# Patient Record
Sex: Female | Born: 2000 | Race: White | Hispanic: No | Marital: Single | Smoking: Never smoker
Health system: Southern US, Community
[De-identification: ages and names within clinical notes are randomized; demographics above are authoritative.]

## PROBLEM LIST (undated history)

## (undated) DIAGNOSIS — F419 Anxiety disorder, unspecified: Secondary | ICD-10-CM

## (undated) DIAGNOSIS — J302 Other seasonal allergic rhinitis: Secondary | ICD-10-CM

## (undated) DIAGNOSIS — M419 Scoliosis, unspecified: Secondary | ICD-10-CM

## (undated) HISTORY — DX: Other seasonal allergic rhinitis: J30.2

## (undated) HISTORY — DX: Scoliosis, unspecified: M41.9

## (undated) HISTORY — DX: Anxiety disorder, unspecified: F41.9

---
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
## 2019-02-25 ENCOUNTER — Other Ambulatory Visit: Payer: Self-pay

## 2020-07-13 ENCOUNTER — Encounter: Payer: Self-pay | Admitting: Nurse Practitioner

## 2020-07-13 ENCOUNTER — Other Ambulatory Visit: Payer: Self-pay

## 2020-07-13 ENCOUNTER — Ambulatory Visit (INDEPENDENT_AMBULATORY_CARE_PROVIDER_SITE_OTHER): Payer: 59 | Admitting: Nurse Practitioner

## 2020-07-13 VITALS — BP 116/74 | Ht 62.0 in | Wt 144.0 lb

## 2020-07-13 DIAGNOSIS — Z01419 Encounter for gynecological examination (general) (routine) without abnormal findings: Secondary | ICD-10-CM

## 2020-07-13 DIAGNOSIS — D229 Melanocytic nevi, unspecified: Secondary | ICD-10-CM

## 2020-07-13 DIAGNOSIS — N946 Dysmenorrhea, unspecified: Secondary | ICD-10-CM | POA: Diagnosis not present

## 2020-07-13 NOTE — Patient Instructions (Signed)
Health Maintenance, Female Adopting a healthy lifestyle and getting preventive care are important in promoting health and wellness. Ask your health care provider about:  The right schedule for you to have regular tests and exams.  Things you can do on your own to prevent diseases and keep yourself healthy. What should I know about diet, weight, and exercise? Eat a healthy diet  Eat a diet that includes plenty of vegetables, fruits, low-fat dairy products, and lean protein.  Do not eat a lot of foods that are high in solid fats, added sugars, or sodium.   Maintain a healthy weight Body mass index (BMI) is used to identify weight problems. It estimates body fat based on height and weight. Your health care provider can help determine your BMI and help you achieve or maintain a healthy weight. Get regular exercise Get regular exercise. This is one of the most important things you can do for your health. Most adults should:  Exercise for at least 150 minutes each week. The exercise should increase your heart rate and make you sweat (moderate-intensity exercise).  Do strengthening exercises at least twice a week. This is in addition to the moderate-intensity exercise.  Spend less time sitting. Even light physical activity can be beneficial. Watch cholesterol and blood lipids Have your blood tested for lipids and cholesterol at 20 years of age, then have this test every 5 years. Have your cholesterol levels checked more often if:  Your lipid or cholesterol levels are high.  You are older than 21 years of age.  You are at high risk for heart disease. What should I know about cancer screening? Depending on your health history and family history, you may need to have cancer screening at various ages. This may include screening for:  Breast cancer.  Cervical cancer.  Colorectal cancer.  Skin cancer.  Lung cancer. What should I know about heart disease, diabetes, and high blood  pressure? Blood pressure and heart disease  High blood pressure causes heart disease and increases the risk of stroke. This is more likely to develop in people who have high blood pressure readings, are of African descent, or are overweight.  Have your blood pressure checked: ? Every 3-5 years if you are 18-39 years of age. ? Every year if you are 40 years old or older. Diabetes Have regular diabetes screenings. This checks your fasting blood sugar level. Have the screening done:  Once every three years after age 40 if you are at a normal weight and have a low risk for diabetes.  More often and at a younger age if you are overweight or have a high risk for diabetes. What should I know about preventing infection? Hepatitis B If you have a higher risk for hepatitis B, you should be screened for this virus. Talk with your health care provider to find out if you are at risk for hepatitis B infection. Hepatitis C Testing is recommended for:  Everyone born from 1945 through 1965.  Anyone with known risk factors for hepatitis C. Sexually transmitted infections (STIs)  Get screened for STIs, including gonorrhea and chlamydia, if: ? You are sexually active and are younger than 21 years of age. ? You are older than 21 years of age and your health care provider tells you that you are at risk for this type of infection. ? Your sexual activity has changed since you were last screened, and you are at increased risk for chlamydia or gonorrhea. Ask your health care provider   if you are at risk.  Ask your health care provider about whether you are at high risk for HIV. Your health care provider may recommend a prescription medicine to help prevent HIV infection. If you choose to take medicine to prevent HIV, you should first get tested for HIV. You should then be tested every 3 months for as long as you are taking the medicine. Pregnancy  If you are about to stop having your period (premenopausal) and  you may become pregnant, seek counseling before you get pregnant.  Take 400 to 800 micrograms (mcg) of folic acid every day if you become pregnant.  Ask for birth control (contraception) if you want to prevent pregnancy. Osteoporosis and menopause Osteoporosis is a disease in which the bones lose minerals and strength with aging. This can result in bone fractures. If you are 65 years old or older, or if you are at risk for osteoporosis and fractures, ask your health care provider if you should:  Be screened for bone loss.  Take a calcium or vitamin D supplement to lower your risk of fractures.  Be given hormone replacement therapy (HRT) to treat symptoms of menopause. Follow these instructions at home: Lifestyle  Do not use any products that contain nicotine or tobacco, such as cigarettes, e-cigarettes, and chewing tobacco. If you need help quitting, ask your health care provider.  Do not use street drugs.  Do not share needles.  Ask your health care provider for help if you need support or information about quitting drugs. Alcohol use  Do not drink alcohol if: ? Your health care provider tells you not to drink. ? You are pregnant, may be pregnant, or are planning to become pregnant.  If you drink alcohol: ? Limit how much you use to 0-1 drink a day. ? Limit intake if you are breastfeeding.  Be aware of how much alcohol is in your drink. In the [place] one drink equals one 12 oz bottle of beer (355 mL), one 5 oz glass of wine (148 mL), or one 1 oz glass of hard liquor (44 mL). General instructions  Schedule regular health, dental, and eye exams.  Stay current with your vaccines.  Tell your health care provider if: ? You often feel depressed. ? You have ever been abused or do not feel safe at home. Summary  Adopting a healthy lifestyle and getting preventive care are important in promoting health and wellness.  Follow your health care provider's instructions about healthy  diet, exercising, and getting tested or screened for diseases.  Follow your health care provider's instructions on monitoring your cholesterol and blood pressure. This information is not intended to replace advice given to you by your health care provider. Make sure you discuss any questions you have with your health care provider. Document Revised: 01/16/2018 Document Reviewed: 01/16/2018 Elsevier Patient Education  2021 Elsevier Inc.  

## 2020-07-13 NOTE — Progress Notes (Signed)
   Kara [name] Apr 06, 2000 [id number]   History:  20 y.o. G0 presents as new patient to establish care. No GYN complaints. She is transferring from pediatrics to here. Monthly cycles. On OCPs for dysmenorrhea. Anxiety/depression, ADHD managed by psychiatry. [place]. She noticed a small bump on left breast recently. Mother present during visit.   Gynecologic History Patient's last menstrual period was 07/07/2020. Period Cycle (Days): 28 Period Duration (Days): 6 Period Pattern: Regular Menstrual Flow: Heavy Dysmenorrhea: (!) Moderate Dysmenorrhea Symptoms: Cramping Contraception/Family planning: abstinence and OCP (estrogen/progesterone)  Health Maintenance Last Pap: Never Last mammogram: Not indicated Last colonoscopy: Not indicated Last Dexa: Not indicated  Past medical history, past surgical history, family history and social history were all reviewed and documented in the EPIC chart. Junior at [employer] for [employer].   ROS:  A ROS was performed and pertinent positives and negatives are included.  Exam:  Vitals:   07/13/20 0910  Weight: 144 lb (65.3 kg)  Height: 5\' 2"  (1.575 m)   Body mass index is 26.34 kg/m.  General appearance:  Normal Thyroid:  Symmetrical, normal in size, without palpable masses or nodularity. Respiratory  Auscultation:  Clear without wheezing or rhonchi Cardiovascular  Auscultation:  Regular rate, without rubs, murmurs or gallops  Edema/varicosities:  Not grossly evident Abdominal  Soft,nontender, without masses, guarding or rebound.  Liver/spleen:  No organomegaly noted  Hernia:  None appreciated  Skin  Inspection:  Grossly normal Breasts: Examined lying and sitting.   Right: Without masses, retractions, nipple discharge or axillary adenopathy.   Left: Without masses, retractions, nipple discharge or axillary adenopathy. Genitourinary - declined by mother  Assessment/Plan:  20 y.o. G0 for annual exam.   Well  female exam with routine gynecological exam - Education provided on SBEs, importance of preventative screenings, current guidelines, high calcium diet, regular exercise, and multivitamin daily. Labs with PCP.   Dysmenorrhea - on OCPs with good management. Refills provided by PCP.   Numerous moles - scattered moles. Bump felt by patient on left breast is a mole. Recommend seeing dermatology for annual skin checks.  Screening for cervical cancer - Discussed current guidelines and recommendations for pap today but mother wants to wait until next visit due to virginal status.   Return in 1 year for annual.      Tamela Gammon DNP, 9:22 AM 07/13/2020

## 2021-07-14 ENCOUNTER — Ambulatory Visit (INDEPENDENT_AMBULATORY_CARE_PROVIDER_SITE_OTHER): Payer: 59 | Admitting: Nurse Practitioner

## 2021-07-14 ENCOUNTER — Other Ambulatory Visit (HOSPITAL_COMMUNITY)
Admission: RE | Admit: 2021-07-14 | Discharge: 2021-07-14 | Disposition: A | Discharge status: Home / Self Care | Payer: 59 | Source: Ambulatory Visit | Attending: Nurse Practitioner | Admitting: Nurse Practitioner

## 2021-07-14 ENCOUNTER — Encounter: Payer: Self-pay | Admitting: Nurse Practitioner

## 2021-07-14 VITALS — BP 114/76 | Ht 62.0 in | Wt 141.0 lb

## 2021-07-14 DIAGNOSIS — Z01419 Encounter for gynecological examination (general) (routine) without abnormal findings: Secondary | ICD-10-CM | POA: Insufficient documentation

## 2021-07-14 DIAGNOSIS — Z3041 Encounter for surveillance of contraceptive pills: Secondary | ICD-10-CM

## 2021-07-14 DIAGNOSIS — N946 Dysmenorrhea, unspecified: Secondary | ICD-10-CM

## 2021-07-14 MED ORDER — PORTIA-28 0.15-30 MG-MCG PO TABS: 1.0000 | ORAL_TABLET | Freq: Every day | ORAL | 3 refills | Status: DC

## 2021-07-14 NOTE — Progress Notes (Signed)
   Kara [name] [birth year] [id number]   History:  21 y.o. G0 presents for annual exam. No GYN complaints. Monthly cycles. On OCPs for dysmenorrhea. Anxiety/depression, ADHD managed by psychiatry. [place].   Gynecologic History Patient's last menstrual period was 06/23/2021. Period Cycle (Days): 28 Period Duration (Days): 6 Period Pattern: Regular Menstrual Flow: Moderate Dysmenorrhea: (!) Moderate Dysmenorrhea Symptoms: Cramping Contraception/Family planning: abstinence and OCP (estrogen/progesterone) Sexually active: No  Health Maintenance Last Pap: Never Last mammogram: Not indicated Last colonoscopy: Not indicated Last Dexa: Not indicated  Past medical history, past surgical history, family history and social history were all reviewed and documented in the EPIC chart. Living in [place]. Graduated from [employer] for [employer] and [employer] this year. Trying to find a job.   ROS:  A ROS was performed and pertinent positives and negatives are included.  Exam:  Vitals:   07/14/21 0858  BP: 114/76  Weight: 141 lb (64 kg)  Height: '5\' 2"'$  (1.575 m)    Body mass index is 25.79 kg/m.  General appearance:  Normal Thyroid:  Symmetrical, normal in size, without palpable masses or nodularity. Respiratory  Auscultation:  Clear without wheezing or rhonchi Cardiovascular  Auscultation:  Regular rate, without rubs, murmurs or gallops  Edema/varicosities:  Not grossly evident Abdominal  Soft,nontender, without masses, guarding or rebound.  Liver/spleen:  No organomegaly noted  Hernia:  None appreciated  Skin  Inspection:  Grossly normal Breasts: Not indicated per guidelines Genitourinary   Inguinal/mons:  Normal without inguinal adenopathy  External genitalia:  Normal appearing vulva with no masses, tenderness, or lesions  BUS/Urethra/Skene's glands:  Normal  Vagina:  Normal appearing with normal color and discharge, no lesions  Cervix:  Unable to visualize due  to difficult exam/discomfort  Anus and perineum: Normal  Digital rectal exam: Not indicated  Patient informed chaperone available to be present for breast and pelvic exam. Patient has requested no chaperone to be present. Patient has been advised what will be completed during breast and pelvic exam.   Assessment/Plan:  21 y.o. G0 for annual exam.   Well female exam with routine gynecological exam - Plan: Cytology - PAP( [place]). Education provided on SBEs, importance of preventative screenings, current guidelines, high calcium diet, regular exercise, and multivitamin daily. Labs with PCP.  Encounter for surveillance of contraceptive pills - Plan: PORTIA-28 0.15-30 MG-MCG tablet daily. Taking as prescribed. Refill x 1 year.   Dysmenorrhea - Plan: PORTIA-28 0.15-30 MG-MCG tablet daily. Good management.   Screening for cervical cancer - Initial pap today. Difficult exam due to discomfort.   Return in 1 year for annual.      Tamela Gammon DNP, 9:34 AM 07/14/2021

## 2021-07-15 LAB — CYTOLOGY - PAP: Diagnosis: NEGATIVE

## 2022-06-21 ENCOUNTER — Other Ambulatory Visit: Payer: Self-pay | Admitting: Nurse Practitioner

## 2022-06-21 DIAGNOSIS — Z3041 Encounter for surveillance of contraceptive pills: Secondary | ICD-10-CM

## 2022-06-21 DIAGNOSIS — N946 Dysmenorrhea, unspecified: Secondary | ICD-10-CM

## 2022-06-21 NOTE — Telephone Encounter (Signed)
Med refill request: Kara [name] Last AEX: 07/14/21 Next AEX: 07/18/22 Refill authorized: Please Advise?

## 2022-07-18 ENCOUNTER — Ambulatory Visit: Payer: 59 | Admitting: Nurse Practitioner

## 2023-10-21 ENCOUNTER — Ambulatory Visit
Admission: RE | Admit: 2023-10-21 | Discharge: 2023-10-21 | Disposition: A | Discharge status: Home / Self Care | Source: Ambulatory Visit | Attending: Physician Assistant | Admitting: Physician Assistant

## 2023-10-21 VITALS — BP 108/76 | HR 74 | Temp 98.3°F | Resp 14 | Ht 62.0 in | Wt 130.0 lb

## 2023-10-21 DIAGNOSIS — J069 Acute upper respiratory infection, unspecified: Secondary | ICD-10-CM | POA: Diagnosis not present

## 2023-10-21 DIAGNOSIS — R0981 Nasal congestion: Secondary | ICD-10-CM | POA: Diagnosis not present

## 2023-10-21 LAB — SARS CORONAVIRUS 2 BY RT PCR: SARS Coronavirus 2 by RT PCR: NEGATIVE

## 2023-10-21 MED ORDER — IPRATROPIUM BROMIDE 0.06 % NA SOLN: 2.0000 | Freq: Four times a day (QID) | NASAL | 0 refills | Status: DC

## 2023-10-21 MED ORDER — PROMETHAZINE-DM 6.25-15 MG/5ML PO SYRP: 5.0000 mL | ORAL_SOLUTION | Freq: Four times a day (QID) | ORAL | 0 refills | Status: DC | PRN

## 2023-10-21 NOTE — ED Triage Notes (Signed)
 Patient c/o cough and chest congestion since Wed.  Patient unsure of fevers.

## 2023-10-21 NOTE — Discharge Instructions (Signed)
 -  Negative COVID.   URI/COLD SYMPTOMS: Your exam today is consistent with a viral illness. Antibiotics are not indicated at this time. Use medications as directed, including cough syrup, nasal saline, and decongestants. Your symptoms should improve over the next few days and resolve within 7-10 days. Increase rest and fluids. F/u if symptoms worsen or predominate such as sore throat, ear pain, productive cough, shortness of breath, or if you develop high fevers or worsening fatigue over the next several days.

## 2023-10-21 NOTE — ED Provider Notes (Signed)
 [facility]    CSN: [id number] Arrival date & time: 10/21/23  1031      History   Chief Complaint Chief Complaint  Patient presents with   Cough    Appointment    HPI Kara [name] is a 23 y.o. female presenting for 4-day history of cough, nasal congestion, sore/scratchy throat and runny nose.  Denies fever, sinus pain, ear pain, chest pain or shortness of breath.  Not reporting any sick contacts.  Not taking any OTC meds.  No history of asthma.  History of allergies and anxiety.  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 No pertinent surgical history.  OB History     Gravida  0   Para  0   Term  0   Preterm  0   AB  0   Living  0      SAB  0   IAB  0   Ectopic  0   Multiple  0   Live Births  0            Home Medications    Prior to Admission medications   Medication Sig Start Date End Date Taking? Authorizing Provider  ipratropium (ATROVENT ) 0.06 % nasal spray Place 2 sprays into both nostrils 4 (four) times daily. 10/21/23  Yes Arvis Jolan NOVAK, PA-C  promethazine -dextromethorphan (PROMETHAZINE -DM) 6.25-15 MG/5ML syrup Take 5 mLs by mouth 4 (four) times daily as needed. 10/21/23  Yes Arvis Jolan B, PA-C  calcium carbonate 200 MG capsule Take 250 mg by mouth 2 (two) times daily with a meal.    [provider]  DOCOSAHEXAENOIC ACID PO Take 2 g by mouth.    [provider]  FLUoxetine (PROZAC) 40 MG capsule 1 pill daily 03/01/18   [provider]  guanFACINE  (INTUNIV ) 2 MG TB24 ER tablet Take 1 tablet (2 mg total) by mouth daily after breakfast. TAKE 1 TABLET BY MOUTH ONCE  DAILY Patient not taking: Reported on 07/13/2020 04/01/18   Tonnie Marcey BRAVO, MD  loratadine (CLARITIN) 10 MG tablet Take 10 mg by mouth daily.    [provider]  methylphenidate  54 MG PO CR tablet Take 54 mg by mouth every morning.    [provider]  Multiple Vitamin (MULTIVITAMIN) tablet Take 1 tablet by mouth daily.    [provider]  PORTIA -28 0.15-30 MG-MCG tablet TAKE 1 TABLET BY MOUTH EVERY DAY 06/21/22   Prentiss Annabella LABOR, NP    Family History Family History  Problem Relation Age of Onset   Depression Mother        2008 inpt tx   Anxiety disorder Mother    Irritable bowel syndrome Mother    Anxiety disorder Maternal Grandmother    Irritable bowel syndrome Maternal Grandmother    Anxiety disorder Maternal Grandfather    Clotting disorder Paternal Grandmother    Depression Other        ECT tx   Hirschsprung's disease Neg Hx     Social History Social History   Tobacco Use   Smoking status: Never   Smokeless tobacco: Never  Vaping Use   Vaping status:  Never Used  Substance Use Topics   Alcohol use: Yes    Comment: Occas   Drug use: Never     Allergies   Penicillins   Review of Systems Review of Systems  Constitutional:  Positive for fatigue. Negative for chills, diaphoresis and fever.  HENT:  Positive for congestion, rhinorrhea and sore throat. Negative for ear pain, sinus pressure and sinus pain.   Respiratory:  Positive for cough. Negative for shortness of breath.   Cardiovascular:  Negative for chest pain.  Gastrointestinal:  Negative for abdominal pain, nausea and vomiting.  Musculoskeletal:  Negative for arthralgias and myalgias.  Skin:  Negative for rash.  Neurological:  Negative for weakness and headaches.  Hematological:  Negative for adenopathy.     Physical Exam Triage Vital Signs ED Triage Vitals  Encounter Vitals Group     BP 10/21/23 1042 108/76     Girls Systolic BP Percentile --      Girls Diastolic BP  Percentile --      Boys Systolic BP Percentile --      Boys Diastolic BP Percentile --      Pulse Rate 10/21/23 1042 74     Resp 10/21/23 1042 14     Temp 10/21/23 1042 98.3 F (36.8 C)     Temp Source 10/21/23 1042 Oral     SpO2 10/21/23 1042 97 %     Weight 10/21/23 1039 130 lb (59 kg)     Height 10/21/23 1039 5' 2 (1.575 m)     Head Circumference --      Peak Flow --      Pain Score 10/21/23 1039 0     Pain Loc --      Pain Education --      Exclude from Growth Chart --    No data found.  Updated Vital Signs BP 108/76 (BP Location: Left Arm)   Pulse 74   Temp 98.3 F (36.8 C) (Oral)   Resp 14   Ht 5' 2 (1.575 m)   Wt 130 lb (59 kg)   LMP 09/23/2023 (Approximate)   SpO2 97%   BMI 23.78 kg/m     Physical Exam Vitals and nursing note reviewed.  Constitutional:      General: She is not in acute distress.    Appearance: Normal appearance. She is not ill-appearing or toxic-appearing.  HENT:     Head: Normocephalic and atraumatic.     Nose: Congestion present.     Mouth/Throat:     Mouth: Mucous membranes are moist.     Pharynx: Oropharynx is clear.  Eyes:     General: No scleral icterus.       Right eye: No discharge.        Left eye: No discharge.     Conjunctiva/sclera: Conjunctivae normal.  Cardiovascular:     Rate and Rhythm: Normal rate and regular rhythm.     Heart sounds: Normal heart sounds.  Pulmonary:     Effort: Pulmonary effort is normal. No respiratory distress.     Breath sounds: Normal breath sounds.  Musculoskeletal:     Cervical back: Neck supple.  Skin:    General: Skin is dry.  Neurological:     General: No focal deficit present.     Mental Status: She is alert. Mental status is at baseline.     Motor: No weakness.     Gait: Gait normal.  Psychiatric:        Mood and Affect: Mood  normal.        Behavior: Behavior normal.      UC Treatments / Results  Labs (all labs ordered are listed, but only abnormal results are  displayed) Labs Reviewed  SARS CORONAVIRUS 2 BY RT PCR    EKG   Radiology No results found.  Procedures Procedures (including critical care time)  Medications Ordered in UC Medications - No data to display  Initial Impression / Assessment and Plan / UC Course  I have reviewed the triage vital signs and the nursing notes.  Pertinent labs & imaging results that were available during my care of the patient were reviewed by me and considered in my medical decision making (see chart for details).   23 year old female presents with 4-day history of fatigue, nasal congestion, runny nose, sore throat and cough.  No fever or shortness of breath.  Vitals are stable and normal and she is overall well-appearing.  No acute distress.  On exam has mild nasal congestion.  Throat without significant erythema.  Chest clear.  COVID test obtained.  Negative.  Viral URI.  Supportive care encouraged increased rest and fluids.  Sent Promethazine  DM and Atrovent  nasal spray to pharmacy.  Reviewed typical course of most viral illnesses.  Work note given.   Final Clinical Impressions(s) / UC Diagnoses   Final diagnoses:  Viral URI with cough  Nasal congestion     Discharge Instructions      -Negative COVID.  URI/COLD SYMPTOMS: Your exam today is consistent with a viral illness. Antibiotics are not indicated at this time. Use medications as directed, including cough syrup, nasal saline, and decongestants. Your symptoms should improve over the next few days and resolve within 7-10 days. Increase rest and fluids. F/u if symptoms worsen or predominate such as sore throat, ear pain, productive cough, shortness of breath, or if you develop high fevers or worsening fatigue over the next several days.       ED Prescriptions     Medication Sig Dispense Auth. Provider   promethazine -dextromethorphan (PROMETHAZINE -DM) 6.25-15 MG/5ML syrup Take 5 mLs by mouth 4 (four) times daily as needed. 118 mL Arvis Huxley B, PA-C   ipratropium (ATROVENT ) 0.06 % nasal spray Place 2 sprays into both nostrils 4 (four) times daily. 15 mL Arvis Huxley NOVAK, PA-C      PDMP not reviewed this encounter.   Arvis Huxley NOVAK, PA-C 10/21/23 1125

## 2023-11-27 ENCOUNTER — Ambulatory Visit
Admission: RE | Admit: 2023-11-27 | Discharge: 2023-11-27 | Disposition: A | Discharge status: Home / Self Care | Attending: Physician Assistant | Admitting: Physician Assistant

## 2023-11-27 VITALS — BP 119/83 | HR 101 | Temp 99.5°F | Resp 16

## 2023-11-27 DIAGNOSIS — J020 Streptococcal pharyngitis: Secondary | ICD-10-CM | POA: Diagnosis not present

## 2023-11-27 DIAGNOSIS — J029 Acute pharyngitis, unspecified: Secondary | ICD-10-CM | POA: Diagnosis not present

## 2023-11-27 DIAGNOSIS — R051 Acute cough: Secondary | ICD-10-CM | POA: Diagnosis not present

## 2023-11-27 LAB — GROUP A STREP BY PCR: Group A Strep by PCR: DETECTED — AB

## 2023-11-27 MED ORDER — IPRATROPIUM BROMIDE 0.06 % NA SOLN: 2.0000 | Freq: Four times a day (QID) | NASAL | 0 refills | Status: AC

## 2023-11-27 MED ORDER — PROMETHAZINE-DM 6.25-15 MG/5ML PO SYRP: 5.0000 mL | ORAL_SOLUTION | Freq: Four times a day (QID) | ORAL | 0 refills | Status: AC | PRN

## 2023-11-27 MED ORDER — CEFDINIR 300 MG PO CAPS: 300.0000 mg | ORAL_CAPSULE | Freq: Two times a day (BID) | ORAL | 0 refills | Status: AC

## 2023-11-27 NOTE — ED Provider Notes (Signed)
 [facility]    CSN: [id number] Arrival date & time: 11/27/23  0827      History   Chief Complaint Chief Complaint  Patient presents with   Sore Throat    My throat has been sore and I've been coughing since last Thursday. It's been getting worse and has now become hard to swallow liquids.  My nose is also stopped up. - Entered by patient   Nasal Congestion   Cough    HPI TRINTY Kara is a 23 y.o. female presenting for 5-day history of cough, nasal congestion, sore/scratchy throat and runny nose. Sore throat has been getting worse.  Denies fever, sinus pain, ear pain, chest pain or shortness of breath.  Not reporting any sick contacts.  Not taking any OTC meds currently, but tried cough medicine recently.  No history of asthma.  History of allergies and anxiety.  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 No pertinent surgical history.  OB History     Gravida  0   Para  0   Term  0   Preterm  0   AB  0   Living  0      SAB  0   IAB  0   Ectopic  0   Multiple  0   Live Births  0            Home Medications    Prior to Admission medications   Medication Sig Start Date End Date Taking? Authorizing Provider  cefdinir (OMNICEF) 300 MG capsule Take 1 capsule (300 mg total) by mouth 2 (two) times daily for 7 days. 11/27/23 12/04/23 Yes Arvis Huxley B, PA-C  ipratropium (ATROVENT ) 0.06 % nasal spray Place 2 sprays into both nostrils 4 (four) times daily. 11/27/23  Yes Arvis Huxley B, PA-C  promethazine -dextromethorphan (PROMETHAZINE -DM) 6.25-15 MG/5ML syrup Take 5 mLs by mouth 4 (four) times daily as needed.  11/27/23  Yes Arvis Huxley B, PA-C  calcium carbonate 200 MG capsule Take 250 mg by mouth 2 (two) times daily with a meal.    [provider]  DOCOSAHEXAENOIC ACID PO Take 2 g by mouth.    [provider]  FLUoxetine (PROZAC) 40 MG capsule 1 pill daily 03/01/18   [provider]  guanFACINE  (INTUNIV ) 2 MG TB24 ER tablet Take 1 tablet (2 mg total) by mouth daily after breakfast. TAKE 1 TABLET BY MOUTH ONCE DAILY Patient not taking: Reported on 07/13/2020 04/01/18   Tonnie Marcey BRAVO, MD  loratadine (CLARITIN) 10 MG tablet Take 10 mg by mouth daily.    [provider]  methylphenidate  54 MG PO CR tablet Take 54 mg by mouth every morning.    [provider]  Multiple Vitamin (MULTIVITAMIN) tablet Take 1 tablet by mouth daily.    [provider]  PORTIA -28 0.15-30 MG-MCG tablet TAKE 1 TABLET BY MOUTH EVERY DAY 06/21/22   Prentiss Annabella LABOR, NP    Family History Family History  Problem Relation Age of Onset   Depression Mother        2008 inpt tx   Anxiety disorder Mother  Irritable bowel syndrome Mother    Anxiety disorder Maternal Grandmother    Irritable bowel syndrome Maternal Grandmother    Anxiety disorder Maternal Grandfather    Clotting disorder Paternal Grandmother    Depression Other        ECT tx   Hirschsprung's disease Neg Hx     Social History Social History   Tobacco Use   Smoking status: Never   Smokeless tobacco: Never  Vaping Use   Vaping status: Never Used  Substance Use Topics   Alcohol use: Yes    Comment: Occas   Drug use: Never     Allergies   Pollen extract and Penicillins   Review of Systems Review of Systems  Constitutional:  Positive for fatigue. Negative for chills, diaphoresis and fever.  HENT:  Positive for congestion, rhinorrhea and sore throat. Negative for ear pain, sinus pressure and sinus pain.   Respiratory:  Positive for cough. Negative for shortness of breath.   Cardiovascular:   Negative for chest pain.  Gastrointestinal:  Negative for abdominal pain, nausea and vomiting.  Musculoskeletal:  Negative for arthralgias and myalgias.  Skin:  Negative for rash.  Neurological:  Negative for weakness and headaches.  Hematological:  Negative for adenopathy.     Physical Exam Triage Vital Signs  No data found.  Updated Vital Signs BP 119/83 (BP Location: Right Arm)   Pulse (!) 101   Temp 99.5 F (37.5 C) (Oral)   Resp 16   LMP  (LMP Unknown)   SpO2 96%     Physical Exam Vitals and nursing note reviewed.  Constitutional:      General: She is not in acute distress.    Appearance: Normal appearance. She is not ill-appearing or toxic-appearing.  HENT:     Head: Normocephalic and atraumatic.     Nose: Congestion present.     Mouth/Throat:     Mouth: Mucous membranes are moist.     Pharynx: Oropharynx is clear. Posterior oropharyngeal erythema present.     Tonsils: 2+ on the right. 2+ on the left.  Eyes:     General: No scleral icterus.       Right eye: No discharge.        Left eye: No discharge.     Conjunctiva/sclera: Conjunctivae normal.  Cardiovascular:     Rate and Rhythm: Normal rate and regular rhythm.     Heart sounds: Normal heart sounds.  Pulmonary:     Effort: Pulmonary effort is normal. No respiratory distress.     Breath sounds: Normal breath sounds.  Musculoskeletal:     Cervical back: Neck supple.  Skin:    General: Skin is dry.  Neurological:     General: No focal deficit present.     Mental Status: She is alert. Mental status is at baseline.     Motor: No weakness.     Gait: Gait normal.  Psychiatric:        Mood and Affect: Mood normal.        Behavior: Behavior normal.      UC Treatments / Results  Labs (all labs ordered are listed, but only abnormal results are displayed) Labs Reviewed  GROUP A STREP BY PCR - Abnormal; Notable for the following components:      Result Value   Group A Strep by PCR DETECTED (*)    All  other components within normal limits    EKG   Radiology No results found.  Procedures Procedures (including critical care  time)  Medications Ordered in UC Medications - No data to display  Initial Impression / Assessment and Plan / UC Course  I have reviewed the triage vital signs and the nursing notes.  Pertinent labs & imaging results that were available during my care of the patient were reviewed by me and considered in my medical decision making (see chart for details).   23 year old female presents with 5-day history of fatigue, nasal congestion, runny nose, sore throat and cough.  Worsening sore throat. No fever or shortness of breath.  Vitals are stable and normal and she is overall well-appearing.  No acute distress.  On exam has mild nasal congestion.  Throat with significant erythema and tonsil swelling.  Chest clear.  PCR strep test obtained. Positive.  Strep pharyngitis/tonsillitis. Sent cefdinir.  Supportive care encouraged increased rest and fluids.  Sent Promethazine  DM and Atrovent  nasal spray to pharmacy.  Work note given.   Final Clinical Impressions(s) / UC Diagnoses   Final diagnoses:  Strep pharyngitis  Acute cough  Sore throat   Discharge Instructions   None     ED Prescriptions     Medication Sig Dispense Auth. Provider   cefdinir (OMNICEF) 300 MG capsule Take 1 capsule (300 mg total) by mouth 2 (two) times daily for 7 days. 14 capsule Arvis Huxley B, PA-C   promethazine -dextromethorphan (PROMETHAZINE -DM) 6.25-15 MG/5ML syrup Take 5 mLs by mouth 4 (four) times daily as needed. 118 mL Arvis Huxley B, PA-C   ipratropium (ATROVENT ) 0.06 % nasal spray Place 2 sprays into both nostrils 4 (four) times daily. 15 mL Arvis Huxley NOVAK, PA-C      PDMP not reviewed this encounter.       Arvis Huxley NOVAK, PA-C 11/27/23 [phone]

## 2023-11-27 NOTE — ED Triage Notes (Signed)
 Pt presents with a runny nose and cough x 5 days and a sore throat since last night. Pt has taken OTC cold medication.
